# Patient Record
Sex: Male | Born: 1935 | Race: White | Hispanic: No | Marital: Married
Health system: Southern US, Community
[De-identification: ages and names within clinical notes are randomized; demographics above are authoritative.]

---

## 2000-11-01 ENCOUNTER — Emergency Department (HOSPITAL_COMMUNITY): Admission: EM | Admit: 2000-11-01 | Discharge: 2000-11-01 | Payer: Self-pay | Admitting: Emergency Medicine

## 2000-11-01 ENCOUNTER — Encounter: Payer: Self-pay | Admitting: Family Medicine

## 2000-11-01 ENCOUNTER — Encounter: Admission: RE | Admit: 2000-11-01 | Discharge: 2000-11-01 | Payer: Self-pay | Admitting: Family Medicine

## 2004-01-03 ENCOUNTER — Emergency Department (HOSPITAL_COMMUNITY): Admission: EM | Admit: 2004-01-03 | Discharge: 2004-01-03 | Payer: Self-pay | Admitting: *Deleted

## 2004-08-15 ENCOUNTER — Inpatient Hospital Stay (HOSPITAL_COMMUNITY): Admission: EM | Admit: 2004-08-15 | Discharge: 2004-08-17 | Payer: Self-pay | Admitting: Emergency Medicine

## 2004-08-25 ENCOUNTER — Inpatient Hospital Stay (HOSPITAL_COMMUNITY): Admission: EM | Admit: 2004-08-25 | Discharge: 2004-08-30 | Payer: Self-pay | Admitting: Emergency Medicine

## 2004-08-25 ENCOUNTER — Ambulatory Visit: Payer: Self-pay | Admitting: Physical Medicine & Rehabilitation

## 2004-08-29 ENCOUNTER — Encounter: Payer: Self-pay | Admitting: Cardiology

## 2004-08-30 ENCOUNTER — Ambulatory Visit: Payer: Self-pay | Admitting: Cardiology

## 2004-08-30 ENCOUNTER — Inpatient Hospital Stay (HOSPITAL_COMMUNITY)
Admission: RE | Admit: 2004-08-30 | Discharge: 2004-09-07 | Payer: Self-pay | Admitting: Physical Medicine & Rehabilitation

## 2004-09-15 ENCOUNTER — Inpatient Hospital Stay (HOSPITAL_COMMUNITY): Admission: EM | Admit: 2004-09-15 | Discharge: 2004-09-19 | Payer: Self-pay | Admitting: Emergency Medicine

## 2004-09-20 ENCOUNTER — Emergency Department (HOSPITAL_COMMUNITY): Admission: EM | Admit: 2004-09-20 | Discharge: 2004-09-20 | Payer: Self-pay | Admitting: Emergency Medicine

## 2004-09-29 ENCOUNTER — Ambulatory Visit (HOSPITAL_COMMUNITY): Admission: RE | Admit: 2004-09-29 | Discharge: 2004-09-29 | Payer: Self-pay | Admitting: Gastroenterology

## 2005-01-19 ENCOUNTER — Encounter: Admission: RE | Admit: 2005-01-19 | Discharge: 2005-04-19 | Payer: Self-pay | Admitting: Neurology

## 2007-06-27 ENCOUNTER — Encounter: Admission: RE | Admit: 2007-06-27 | Discharge: 2007-06-27 | Payer: Self-pay | Admitting: Family Medicine

## 2008-08-25 ENCOUNTER — Encounter: Admission: RE | Admit: 2008-08-25 | Discharge: 2008-08-25 | Payer: Self-pay | Admitting: Family Medicine

## 2009-06-08 ENCOUNTER — Emergency Department (HOSPITAL_COMMUNITY): Admission: EM | Admit: 2009-06-08 | Discharge: 2009-06-08 | Payer: Self-pay | Admitting: Emergency Medicine

## 2009-08-17 ENCOUNTER — Inpatient Hospital Stay (HOSPITAL_COMMUNITY): Admission: EM | Admit: 2009-08-17 | Discharge: 2009-08-19 | Payer: Self-pay | Admitting: Emergency Medicine

## 2009-08-17 ENCOUNTER — Ambulatory Visit: Payer: Self-pay | Admitting: Cardiology

## 2009-08-18 ENCOUNTER — Encounter (INDEPENDENT_AMBULATORY_CARE_PROVIDER_SITE_OTHER): Payer: Self-pay | Admitting: Internal Medicine

## 2010-08-02 ENCOUNTER — Inpatient Hospital Stay (HOSPITAL_COMMUNITY): Admission: EM | Admit: 2010-08-02 | Discharge: 2010-08-08 | Payer: Self-pay | Admitting: Emergency Medicine

## 2010-08-04 DIAGNOSIS — F068 Other specified mental disorders due to known physiological condition: Secondary | ICD-10-CM

## 2010-09-24 ENCOUNTER — Emergency Department (HOSPITAL_COMMUNITY)
Admission: EM | Admit: 2010-09-24 | Discharge: 2010-09-24 | Payer: Self-pay | Source: Home / Self Care | Admitting: Emergency Medicine

## 2010-10-09 ENCOUNTER — Observation Stay (HOSPITAL_COMMUNITY)
Admission: EM | Admit: 2010-10-09 | Discharge: 2010-10-12 | Payer: Self-pay | Source: Home / Self Care | Attending: Internal Medicine | Admitting: Internal Medicine

## 2010-10-10 LAB — URINALYSIS, ROUTINE W REFLEX MICROSCOPIC
Hgb urine dipstick: NEGATIVE
Ketones, ur: NEGATIVE mg/dL
Leukocytes, UA: NEGATIVE
Nitrite: NEGATIVE
Protein, ur: 100 mg/dL — AB
Specific Gravity, Urine: 1.023 (ref 1.005–1.030)
Urine Glucose, Fasting: NEGATIVE mg/dL
Urobilinogen, UA: 1 mg/dL (ref 0.0–1.0)
pH: 5.5 (ref 5.0–8.0)

## 2010-10-10 LAB — URINE MICROSCOPIC-ADD ON

## 2010-10-10 LAB — DIFFERENTIAL
Basophils Absolute: 0 10*3/uL (ref 0.0–0.1)
Basophils Relative: 0 % (ref 0–1)
Eosinophils Absolute: 0.2 10*3/uL (ref 0.0–0.7)
Eosinophils Relative: 3 % (ref 0–5)
Lymphocytes Relative: 20 % (ref 12–46)
Lymphs Abs: 1.4 10*3/uL (ref 0.7–4.0)
Monocytes Absolute: 0.7 10*3/uL (ref 0.1–1.0)
Monocytes Relative: 9 % (ref 3–12)
Neutro Abs: 4.9 10*3/uL (ref 1.7–7.7)
Neutrophils Relative %: 68 % (ref 43–77)

## 2010-10-10 LAB — BASIC METABOLIC PANEL
BUN: 25 mg/dL — ABNORMAL HIGH (ref 6–23)
CO2: 26 mEq/L (ref 19–32)
Calcium: 9.2 mg/dL (ref 8.4–10.5)
Chloride: 105 mEq/L (ref 96–112)
Creatinine, Ser: 1.2 mg/dL (ref 0.4–1.5)
GFR calc Af Amer: >60 mL/min (ref >60)
GFR calc non Af Amer: 57 mL/min — ABNORMAL LOW (ref >60)
Glucose, Bld: 104 mg/dL — ABNORMAL HIGH (ref 70–99)
Potassium: 4.1 mEq/L (ref 3.5–5.1)
Sodium: 138 mEq/L (ref 135–145)

## 2010-10-10 LAB — CBC
HCT: 35.3 % — ABNORMAL LOW (ref 39.0–52.0)
Hemoglobin: 11.6 g/dL — ABNORMAL LOW (ref 13.0–17.0)
MCH: 31.3 pg (ref 26.0–34.0)
MCHC: 32.9 g/dL (ref 30.0–36.0)
MCV: 95.1 fL (ref 78.0–100.0)
Platelets: 192 10*3/uL (ref 150–400)
RBC: 3.71 MIL/uL — ABNORMAL LOW (ref 4.22–5.81)
RDW: 12.9 % (ref 11.5–15.5)
WBC: 7.2 10*3/uL (ref 4.0–10.5)

## 2010-10-10 LAB — CK TOTAL AND CKMB (NOT AT ARMC)
CK, MB: 1.6 ng/mL (ref 0.3–4.0)
Relative Index: INVALID (ref 0.0–2.5)
Total CK: 32 U/L (ref 7–232)

## 2010-10-10 LAB — LACTIC ACID, PLASMA: Lactic Acid, Venous: 1.2 mmol/L (ref 0.5–2.2)

## 2010-10-10 LAB — MRSA PCR SCREENING: MRSA by PCR: NEGATIVE

## 2010-10-10 LAB — PROCALCITONIN: Procalcitonin: <0.1 ng/mL

## 2010-10-10 LAB — TROPONIN I: Troponin I: 0.02 ng/mL (ref 0.00–0.06)

## 2010-10-12 LAB — COMPREHENSIVE METABOLIC PANEL
ALT: 21 U/L (ref 0–53)
AST: 26 U/L (ref 0–37)
Albumin: 2.7 g/dL — ABNORMAL LOW (ref 3.5–5.2)
Alkaline Phosphatase: 44 U/L (ref 39–117)
BUN: 21 mg/dL (ref 6–23)
CO2: 26 mEq/L (ref 19–32)
Calcium: 8.5 mg/dL (ref 8.4–10.5)
Chloride: 105 mEq/L (ref 96–112)
Creatinine, Ser: 1.12 mg/dL (ref 0.4–1.5)
GFR calc Af Amer: >60 mL/min (ref >60)
GFR calc non Af Amer: >60 mL/min (ref >60)
Glucose, Bld: 94 mg/dL (ref 70–99)
Potassium: 3.2 mEq/L — ABNORMAL LOW (ref 3.5–5.1)
Sodium: 136 mEq/L (ref 135–145)
Total Bilirubin: 0.6 mg/dL (ref 0.3–1.2)
Total Protein: 5.7 g/dL — ABNORMAL LOW (ref 6.0–8.3)

## 2010-10-12 LAB — VITAMIN B12: Vitamin B-12: 1473 pg/mL — ABNORMAL HIGH (ref 211–911)

## 2010-10-12 LAB — BASIC METABOLIC PANEL
BUN: 17 mg/dL (ref 6–23)
CO2: 23 mEq/L (ref 19–32)
Calcium: 8.8 mg/dL (ref 8.4–10.5)
Chloride: 112 mEq/L (ref 96–112)
Creatinine, Ser: 1.12 mg/dL (ref 0.4–1.5)
GFR calc Af Amer: >60 mL/min (ref >60)
GFR calc non Af Amer: >60 mL/min (ref >60)
Glucose, Bld: 92 mg/dL (ref 70–99)
Potassium: 4.2 mEq/L (ref 3.5–5.1)
Sodium: 141 mEq/L (ref 135–145)

## 2010-10-12 LAB — CARDIAC PANEL(CRET KIN+CKTOT+MB+TROPI)
CK, MB: 1.9 ng/mL (ref 0.3–4.0)
Relative Index: INVALID (ref 0.0–2.5)
Total CK: 40 U/L (ref 7–232)
Troponin I: 0.05 ng/mL (ref 0.00–0.06)

## 2010-10-12 LAB — PHOSPHORUS: Phosphorus: 3 mg/dL (ref 2.3–4.6)

## 2010-10-12 LAB — HEPATIC FUNCTION PANEL
ALT: 23 U/L (ref 0–53)
AST: 33 U/L (ref 0–37)
Albumin: 3 g/dL — ABNORMAL LOW (ref 3.5–5.2)
Alkaline Phosphatase: 50 U/L (ref 39–117)
Bilirubin, Direct: 0.3 mg/dL (ref 0.0–0.3)
Indirect Bilirubin: 0.4 mg/dL (ref 0.3–0.9)
Total Bilirubin: 0.7 mg/dL (ref 0.3–1.2)
Total Protein: 6.2 g/dL (ref 6.0–8.3)

## 2010-10-12 LAB — CBC
HCT: 33.9 % — ABNORMAL LOW (ref 39.0–52.0)
Hemoglobin: 11.5 g/dL — ABNORMAL LOW (ref 13.0–17.0)
MCH: 32 pg (ref 26.0–34.0)
MCHC: 33.9 g/dL (ref 30.0–36.0)
MCV: 94.4 fL (ref 78.0–100.0)
Platelets: 197 10*3/uL (ref 150–400)
RBC: 3.59 MIL/uL — ABNORMAL LOW (ref 4.22–5.81)
RDW: 13.2 % (ref 11.5–15.5)
WBC: 9.9 10*3/uL (ref 4.0–10.5)

## 2010-10-12 LAB — URINE CULTURE
Colony Count: NO GROWTH
Culture  Setup Time: 201201151741
Culture: NO GROWTH

## 2010-10-12 LAB — MAGNESIUM
Magnesium: 1.9 mg/dL (ref 1.5–2.5)
Magnesium: 1.9 mg/dL (ref 1.5–2.5)

## 2010-10-12 LAB — AMMONIA
Ammonia: UNDETERMINED umol/L (ref 11–35)
Ammonia: 12 umol/L (ref 11–35)

## 2010-10-12 LAB — TSH: TSH: 2.531 u[IU]/mL (ref 0.350–4.500)

## 2010-10-12 LAB — LIPASE, BLOOD: Lipase: 22 U/L (ref 11–59)

## 2010-10-14 NOTE — Discharge Summary (Signed)
NAME:  Michael Case, Michael Case              ACCOUNT NO.:  [id number]  MEDICAL RECORD NO.:  [id number]          PATIENT TYPE:  INP  LOCATION:  4709                         FACILITY:  [facility]  PHYSICIAN:  Kathlen Mody, MD       DATE OF BIRTH:  01-Nov-1920  DATE OF ADMISSION:  10/09/2010 DATE OF DISCHARGE:                              DISCHARGE SUMMARY   PRIMARY CARE PHYSICIAN:  Lupita Raider, M.D.  PRIMARY DISCHARGE DIAGNOSIS:  Worsening dementia.  OTHER DIAGNOSES: 1. Vascular dementia with behavioral disturbances. 2. Hypertension. 3. Recurrent falls. 4. Chronic kidney disease stage 3. 5. Cerebrovascular accident with right-sided weakness. 6. Benign prostatic hypertrophy. 7. Cervical spondylosis. 8. Hyperlipidemia. 9. Erosive esophagitis. 10.Anemia. 11.Ataxia. 12.Abdominal aortic aneurysm. 13.Gastrointestinal bleed secondary to duodenal ulcers. 14.Dyslipidemia.  DISCHARGE MEDICATIONS: 1. Amlodipine 10 mg p.o. daily. 2. Aspirin/dipyridamole combination that is Aggrenox 1 capsule p.o.     b.i.d. 3. Atenolol 50 mg p.o. daily. 4. Depakote 250 mg p.o. b.i.d. 5. Hydralazine 100 mg b.i.d. 6. Risperidone 0.5 mg at bedtime. 7. Senna 2 tabs p.o. daily. 8. Vicodin 1-2 tablets p.o. q.4 hours p.r.n. 9. MiraLax 17 g p.o. daily p.r.n.  PERTINENT LABS:  She had a CBC done on October 11, 2010, that showed: WBC count of 9.9, hemoglobin of 11.5, hematocrit of 33.9, and platelets of 197.  Sodium of 141, potassium 4.2, chloride 112, bicarb 23, glucose 92, BUN 17, creatinine 1.12, mag of 1.9.  TSH of 2.531, vitamin B12 of 1473.  Urine culture:  No growth.  Ammonia level was 12.  Lipase level was 22.  Cardiac enzymes with CK/MB and troponin negative.  MRSA screen negative.  Liver function tests normal limits.  Procalcitonin negative. Lactic acid 1.2.  Urinalysis was negative.  RADIOLOGY/IMAGING:  Chest x-ray:  No acute cardiopulmonary disease. Abdominal x-ray:  No evidence of obstruction.  Fecal  burden suggesting constipation.  CT head without contrast:  No acute abnormality, ischemic change, or atrophy; appears unchanged.  BRIEF HOSPITAL COURSE:  This is an 77 year old gentleman with past medical history of vascular dementia, multiple cerebrovascular accidents with chronic right-sided weakness, history of behavioral disturbances, who was admitted for decreased oral intake to both solids and liquids for the last 3-4 days. 1. Decreased oral intake.  This was accounted to be secondary to     progression of his dementia.  A CT of the head without contrast was     done to rule out stroke.  He was worked up for encephalopathy and     everything came back negative.  TSH was normal.  LFTs were normal.     Lipase and ammonia levels were normal. 2. The patient has dementia with episodes of acute delirium and that     the best care for the patient would be placement at SNF.  At this     time, we will continue with his medications which are Depakote,     Risperdal, and pain medications as needed.  Social worker consult was called and the family discussed with the social worker may find     financial arrangements for the patient to be discharged to [facility]. 3. The rest of the issues include hypertension which was suboptimally     controlled.  The patient was on Norvasc and atenolol and     hydralazine, and we increased the hydralazine to 100 mg twice a     day, increase the atenolol to 50 mg daily. 4. For his multiple cerebrovascular accidents in the past, the patient     was continued on his Aggrenox 1 capsule b.i.d.  The rest of the assessment will be done by the doctor discharging the patient tomorrow.          ______________________________ Kathlen Mody, MD     VA/MEDQ  D:  10/11/2010  T:  10/11/2010  Job:  [id number]  Electronically Signed by Kathlen Mody MD on 10/14/2010 11:25:17 AM

## 2010-10-17 LAB — EYE CULTURE

## 2010-11-24 DEATH — deceased

## 2010-12-06 LAB — DIFFERENTIAL
Basophils Absolute: 0.1 10*3/uL (ref 0.0–0.1)
Basophils Relative: 1 % (ref 0–1)
Eosinophils Absolute: 0.4 10*3/uL (ref 0.0–0.7)
Eosinophils Relative: 5 % (ref 0–5)
Lymphocytes Relative: 20 % (ref 12–46)
Lymphocytes Relative: 22 % (ref 12–46)
Lymphs Abs: 1.9 10*3/uL (ref 0.7–4.0)
Monocytes Absolute: 0.5 10*3/uL (ref 0.1–1.0)
Monocytes Relative: 9 % (ref 3–12)
Monocytes Relative: 9 % (ref 3–12)
Neutro Abs: 3.9 10*3/uL (ref 1.7–7.7)
Neutrophils Relative %: 63 % (ref 43–77)

## 2010-12-06 LAB — BASIC METABOLIC PANEL
CO2: 30 mEq/L (ref 19–32)
Calcium: 9 mg/dL (ref 8.4–10.5)
Calcium: 9.1 mg/dL (ref 8.4–10.5)
Calcium: 9.3 mg/dL (ref 8.4–10.5)
Calcium: 9.4 mg/dL (ref 8.4–10.5)
Chloride: 102 mEq/L (ref 96–112)
Chloride: 105 mEq/L (ref 96–112)
Creatinine, Ser: 1.32 mg/dL (ref 0.4–1.5)
GFR calc Af Amer: 57 mL/min — ABNORMAL LOW (ref >60)
GFR calc Af Amer: 59 mL/min — ABNORMAL LOW (ref >60)
GFR calc Af Amer: 60 mL/min — ABNORMAL LOW (ref >60)
GFR calc Af Amer: >60 mL/min (ref >60)
GFR calc non Af Amer: 47 mL/min — ABNORMAL LOW (ref >60)
GFR calc non Af Amer: 49 mL/min — ABNORMAL LOW (ref >60)
Glucose, Bld: 104 mg/dL — ABNORMAL HIGH (ref 70–99)
Glucose, Bld: 95 mg/dL (ref 70–99)
Potassium: 3.8 mEq/L (ref 3.5–5.1)
Potassium: 4.3 mEq/L (ref 3.5–5.1)
Sodium: 137 mEq/L (ref 135–145)
Sodium: 138 mEq/L (ref 135–145)
Sodium: 138 mEq/L (ref 135–145)
Sodium: 141 mEq/L (ref 135–145)

## 2010-12-06 LAB — COMPREHENSIVE METABOLIC PANEL
ALT: 10 U/L (ref 0–53)
AST: 24 U/L (ref 0–37)
Albumin: 2.8 g/dL — ABNORMAL LOW (ref 3.5–5.2)
Calcium: 8.9 mg/dL (ref 8.4–10.5)
GFR calc Af Amer: 58 mL/min — ABNORMAL LOW (ref >60)
Sodium: 138 mEq/L (ref 135–145)
Total Protein: 6.5 g/dL (ref 6.0–8.3)

## 2010-12-06 LAB — CBC
HCT: 29.8 % — ABNORMAL LOW (ref 39.0–52.0)
HCT: 30.3 % — ABNORMAL LOW (ref 39.0–52.0)
Hemoglobin: 10.1 g/dL — ABNORMAL LOW (ref 13.0–17.0)
Hemoglobin: 10.3 g/dL — ABNORMAL LOW (ref 13.0–17.0)
Hemoglobin: 10.4 g/dL — ABNORMAL LOW (ref 13.0–17.0)
MCH: 32.1 pg (ref 26.0–34.0)
MCH: 32.5 pg (ref 26.0–34.0)
MCHC: 33.9 g/dL (ref 30.0–36.0)
MCHC: 34 g/dL (ref 30.0–36.0)
MCV: 95.8 fL (ref 78.0–100.0)
Platelets: 297 10*3/uL (ref 150–400)
Platelets: 298 10*3/uL (ref 150–400)
RBC: 3.11 MIL/uL — ABNORMAL LOW (ref 4.22–5.81)
RBC: 3.24 MIL/uL — ABNORMAL LOW (ref 4.22–5.81)
RDW: 12.5 % (ref 11.5–15.5)
WBC: 6.1 10*3/uL (ref 4.0–10.5)
WBC: 6.5 10*3/uL (ref 4.0–10.5)
WBC: 8.7 10*3/uL (ref 4.0–10.5)

## 2010-12-06 LAB — LIPID PANEL
Cholesterol: 153 mg/dL (ref 0–200)
HDL: 38 mg/dL — ABNORMAL LOW (ref >39)
LDL Cholesterol: 90 mg/dL (ref 0–99)
Triglycerides: 125 mg/dL (ref <150)

## 2010-12-06 LAB — FOLATE: Folate: >20 ng/mL

## 2010-12-06 LAB — URINALYSIS, ROUTINE W REFLEX MICROSCOPIC
Glucose, UA: NEGATIVE mg/dL
Hgb urine dipstick: NEGATIVE
Specific Gravity, Urine: 1.017 (ref 1.005–1.030)
Urobilinogen, UA: 0.2 mg/dL (ref 0.0–1.0)

## 2010-12-06 LAB — CARDIAC PANEL(CRET KIN+CKTOT+MB+TROPI)
CK, MB: 1.6 ng/mL (ref 0.3–4.0)
CK, MB: 1.7 ng/mL (ref 0.3–4.0)
Relative Index: INVALID (ref 0.0–2.5)
Relative Index: INVALID (ref 0.0–2.5)
Total CK: 66 U/L (ref 7–232)
Total CK: 71 U/L (ref 7–232)

## 2010-12-06 LAB — MAGNESIUM: Magnesium: 2.1 mg/dL (ref 1.5–2.5)

## 2010-12-06 LAB — PHOSPHORUS: Phosphorus: 3.7 mg/dL (ref 2.3–4.6)

## 2010-12-06 LAB — TROPONIN I: Troponin I: 0.04 ng/mL (ref 0.00–0.06)

## 2010-12-06 LAB — TSH: TSH: 2.495 u[IU]/mL (ref 0.350–4.500)

## 2010-12-06 LAB — CK TOTAL AND CKMB (NOT AT ARMC)
Relative Index: INVALID (ref 0.0–2.5)
Total CK: 60 U/L (ref 7–232)
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
## 2010-12-28 LAB — LIPID PANEL
LDL Cholesterol: 116 mg/dL — ABNORMAL HIGH (ref 0–99)
Total CHOL/HDL Ratio: 5.6 RATIO
VLDL: 31 mg/dL (ref 0–40)

## 2010-12-28 LAB — COMPREHENSIVE METABOLIC PANEL
AST: 47 U/L — ABNORMAL HIGH (ref 0–37)
Albumin: 3.5 g/dL (ref 3.5–5.2)
BUN: 19 mg/dL (ref 6–23)
Calcium: 9.2 mg/dL (ref 8.4–10.5)
Creatinine, Ser: 1.64 mg/dL — ABNORMAL HIGH (ref 0.4–1.5)
GFR calc Af Amer: 48 mL/min — ABNORMAL LOW (ref >60)
Total Protein: 6.8 g/dL (ref 6.0–8.3)

## 2010-12-28 LAB — DIFFERENTIAL
Basophils Absolute: 0.1 10*3/uL (ref 0.0–0.1)
Lymphocytes Relative: 16 % (ref 12–46)
Lymphs Abs: 1.1 10*3/uL (ref 0.7–4.0)
Monocytes Absolute: 0.7 10*3/uL (ref 0.1–1.0)
Monocytes Relative: 10 % (ref 3–12)
Neutro Abs: 4.7 10*3/uL (ref 1.7–7.7)

## 2010-12-28 LAB — CBC
HCT: 34.1 % — ABNORMAL LOW (ref 39.0–52.0)
MCV: 99 fL (ref 78.0–100.0)
Platelets: 163 10*3/uL (ref 150–400)
RDW: 13.1 % (ref 11.5–15.5)

## 2010-12-28 LAB — CARDIAC PANEL(CRET KIN+CKTOT+MB+TROPI)
CK, MB: 1.7 ng/mL (ref 0.3–4.0)
CK, MB: 1.9 ng/mL (ref 0.3–4.0)
Relative Index: INVALID (ref 0.0–2.5)
Troponin I: 0.02 ng/mL (ref 0.00–0.06)
Troponin I: 0.02 ng/mL (ref 0.00–0.06)

## 2010-12-28 LAB — HEMOGLOBIN A1C
Hgb A1c MFr Bld: 5.8 % (ref 4.6–6.1)
Mean Plasma Glucose: 120 mg/dL

## 2010-12-28 LAB — URINALYSIS, ROUTINE W REFLEX MICROSCOPIC
Nitrite: NEGATIVE
Specific Gravity, Urine: 1.013 (ref 1.005–1.030)
Urobilinogen, UA: 0.2 mg/dL (ref 0.0–1.0)
pH: 7 (ref 5.0–8.0)

## 2010-12-28 LAB — CK TOTAL AND CKMB (NOT AT ARMC)
CK, MB: 1.7 ng/mL (ref 0.3–4.0)
Relative Index: 1.5 (ref 0.0–2.5)
Total CK: 116 U/L (ref 7–232)

## 2010-12-28 LAB — HOMOCYSTEINE: Homocysteine: 11.9 umol/L (ref 4.0–15.4)

## 2010-12-30 LAB — CBC
HCT: 36.9 % — ABNORMAL LOW (ref 39.0–52.0)
MCV: 100.1 fL — ABNORMAL HIGH (ref 78.0–100.0)
Platelets: 192 10*3/uL (ref 150–400)
RDW: 13.1 % (ref 11.5–15.5)

## 2010-12-30 LAB — DIFFERENTIAL
Lymphocytes Relative: 24 % (ref 12–46)
Monocytes Absolute: 0.7 10*3/uL (ref 0.1–1.0)
Monocytes Relative: 9 % (ref 3–12)
Neutro Abs: 4.3 10*3/uL (ref 1.7–7.7)

## 2010-12-30 LAB — COMPREHENSIVE METABOLIC PANEL
Albumin: 3.8 g/dL (ref 3.5–5.2)
BUN: 20 mg/dL (ref 6–23)
Creatinine, Ser: 1.61 mg/dL — ABNORMAL HIGH (ref 0.4–1.5)
Potassium: 3.6 mEq/L (ref 3.5–5.1)
Total Protein: 7.6 g/dL (ref 6.0–8.3)

## 2010-12-30 LAB — POCT CARDIAC MARKERS
CKMB, poc: 1 ng/mL (ref 1.0–8.0)
Myoglobin, poc: 110 ng/mL (ref 12–200)
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
## 2011-02-10 NOTE — Consult Note (Signed)
NAME:  MATTEO, BANKE NO.:  [id number]   MEDICAL RECORD NO.:  [id number]          PATIENT TYPE:  INP   LOCATION:  3301                         FACILITY:  [facility]   PHYSICIAN:  Graylin Shiver, M.D.   DATE OF BIRTH:  09/15/1921   DATE OF CONSULTATION:  09/15/2004  DATE OF DISCHARGE:                                   CONSULTATION   REASON FOR CONSULTATION:  This patient is an 77 year old white male recently  discharged from the hospital after a stroke.  He presented to the emergency  room with complaints of melena, which occurred approximately four hours ago.  The patient's wife states that he had an episode of black-colored bowel  movement and that he passed out.  She called 911 and the EMS came, and  brought him to the hospital.  The patient denies any abdominal pain,  vomiting or hematemesis.  He has not had any further passing of melena since  the one episode four hours ago.  The patient's wife states that she told him  yesterday that he was looking pale and she also states that he has not been  feeling very good for the last two or three days.   PAST HISTORY:  Medical problems:  1.  History of a stroke.  2.  Hypertension.  3.  Benign prostatic hypertrophy.   Surgeries:  Ankle pinning.   SOCIAL HISTORY:  Negative for alcohol or tobacco.   SYSTEMS REVIEW:  The patient denies any angina, chest pain, shortness of  breath, cough, or sputum production.   MEDICATIONS:  Medications taken prior to admission include Aggrenox, Tricor,  Lotrel and UroXatral.   PHYSICAL EXAMINATION:  VITAL SIGNS:  Blood pressure is 131/73, pulse 92 and  respirations 19.  GENERAL APPEARANCE:  The patient does not appear in any acute distress at  this time.  HEENT:  The patient is nonicteric.  HEART:  Regular rhythm.  No murmurs are heard.  LUNGS:  The lungs are clear.  ABDOMEN:  Bowel sounds are normal.  Soft and nontender.  No  hepatosplenomegaly.   LABORATORY DATA:  Hemoglobin  is 7.1.   IMPRESSION:  1.  Melena, suspect upper gastrointestinal source, rule out peptic ulcer,      rule out gastritis versus other upper gastrointestinal lesion.  2.  History of stroke.   PLAN:  The patient has been admitted to the hospital by Dr. Tresa Endo.  He is  going to start Protonix 40 mg IV q.12 hours.  The patient has received one  unit of packed red cells already and is receiving a second unit, and will be  receiving another unit of packed red cells.  I will proceed with EGD in the  morning to evaluate the upper GI tract.       SFG/MEDQ  D:  09/15/2004  T:  09/16/2004  Job:  [id number]   cc:   Sherin Quarry, MD   Donia Guiles, M.D.  [address] [place]  [place] [address]  Fax: [phone]

## 2011-02-10 NOTE — Discharge Summary (Signed)
NAME:  Michael Case, Michael Case              ACCOUNT NO.:  [id number]   MEDICAL RECORD NO.:  [id number]          PATIENT TYPE:  INP   LOCATION:  3005                         FACILITY:  [facility]   PHYSICIAN:  Pramod P. Pearlean Brownie, MD    DATE OF BIRTH:  [birth year]   DATE OF ADMISSION:  08/24/2004  DATE OF DISCHARGE:  08/30/2004                                 DISCHARGE SUMMARY   DIAGNOSES AT TIME OF DISCHARGE:  1.  New left anterior circulation infarct in the setting of recent left      posterior circulation infarct.  2.  Report of rectal bleeding with bowel movements at home.  3.  Hypertension.  4.  Dyslipidemia.  5.  Prostatism.  6.  History of left occipital embolic infarct August 17, 2004 without      etiology identified on Aggrenox.   DISCHARGE MEDICATIONS:  1.  Tricor 48 mg daily.  2.  Norvasc 10 mg daily.  3.  Lotensin 20 mg daily.  4.  Uroxatral 10 mg daily.  5.  Aggrenox 1 tablet b.i.d.  6.  Colace 100 mg b.i.d.   STUDIES PERFORMED:  1.  CT of the brain on admission shows stroke evolution and subacute strokes      in the left PCA distribution and the left cerebellum from August 16, 2004.  No signs of stroke extension, acute hemorrhage, or other mass      effect.  2.  MRI of the brain shows development of an acute left MCA distribution      infarct which is extensive involving the left frontal and parietal      lobes.  There is evidence of evolution of left PCA, left occipital lobe      infarct which appears larger now with more extensive signal alteration      and coverage of a larger portion of the left occipital lobe plus      hemorrhagic transformation.  3.  MRA of the brain shows subtle interval changes of the left MCA distal      branches when compared to November 22nd.  Left SCA and PCA disease.  4.  TEE performed by Dr. Olga Millers shows no evidence for mitral or      aortic valvular vegetations, no findings to suggest a thrombus in left      ventricle or on the  left atrial appendage.  There was some left      ventricular hypertrophy.  5.  EKG shows normal sinus rhythm with moderate voltage criteria for LVH,      may be a normal variant, T-wave abnormality consider lateral ischemia.  6.  Transcranial Doppler was negative with no abnormal intracardiac      communication.   LABORATORY STUDIES:  CBC normal, differential with eosinophils elevated at  7.  Chemistry normal except for glucose slightly elevated at 112, bicarb  slightly elevated at 26.7.  Coagulation studies normal.   HISTORY OF PRESENT ILLNESS:  [name] is an 77 year old right-  handed white male who was just discharged from the stroke service a week  ago  after suffering a left occipital embolic infarct with right visual field  loss.  He was worked up extensively and discharged on Aggrenox and Tricor.  The mechanism of the stroke was felt to be atheroemboli from a narrowed  right vertebral artery.  The patient reports this evening he was bathing  about 5:30 p.m. and while drying off noticed an onset of right-sided  hemiparesis and sensory change.  He noticed problems with his right leg when  he was walking.  He talked to his wife and noticed that he had some hesitant  and slurred speech.  EMS was called and the patient was brought to the [facility] Room where he gradually improved.  Although he feels  subjectively that his right side has not come all the way back around to  normal, it is felt that he is near baseline.  He will be admitted to the  hospital for further stroke workup.   HOSPITAL COURSE:  MRI did reveal a new infarct in new territory on the left  anterior MCA division.  Since he had 2 separate infarcts within a short  period of time in 2 distributions, it is felt his stroke was mostly  cardioembolic.  A 2-D echo and carotid Doppler last week were normal so we  will perform a TEE.  This TEE was performed by Dr. Jens Som with no etiology  of the stroke  identified.  Preference would have been to place the patient  on the IV heparin while the workup was pending; however, he had had an  episode of rectal bleeding per him and his wife after discharge.  Therefore,  heparin was held.  Without obvious embolic etiology found, the patient was  placed back on Aggrenox and this will be continued long-term.  PT and OT and  speech therapy all saw him and he does have some hemiparesis and decreased  ability with his walking.  Therefore, he will need short-term rehab before  returns home with wife.   CONDITION ON DISCHARGE:  The patient alert and oriented x3.  Speech clear.  No aphasia.  He has mild right hemiparesis with clumsiness but 5-/5 strength  in his upper extremity and lower extremity.  He was not ambulated the day of  discharge.   DISCHARGE PLAN:  1.  Transfer to rehab for continuation of therapy.  2.  Aggrenox for secondary stroke prevention.  3.  New Tricor for dyslipidemia.  4.  Follow up primary care physician in 1 month.  5.  Follow up with Dr. Pearlean Brownie in 2-3 months.       SB/MEDQ  D:  08/30/2004  T:  08/30/2004  Job:  [id number]   cc:   Donia Guiles, M.D.  [address] [place]  [place] [address]  Fax: [phone]

## 2011-02-10 NOTE — Discharge Summary (Signed)
NAME:  [name], [name]              ACCOUNT NO.:  [id number]   MEDICAL RECORD NO.:  [id number]          PATIENT TYPE:  INP   LOCATION:  3028                         FACILITY:  [facility]   PHYSICIAN:  Donia Guiles, M.D.   DATE OF BIRTH:  Dec 02, 1920   DATE OF ADMISSION:  08/15/2004  DATE OF DISCHARGE:  08/17/2004                                 DISCHARGE SUMMARY   ADDENDUM:  As noted in discharge summary, the patient had white blood cell  increase from 8 to 23 with negative urine culture, however, had traumatic  Foley insertion.  Foley discontinued.  Patient advised to remain in the  hospital until able to void independently.  Will put on Cipro 500 mg b.i.d.  x3 days.  Patient instructed to call Dr. Donia Guiles if he has any  problems voiding over the next few days.  The patient is very belligerent  and when instructed he will remain in hospital, however, my best  professional advise to him.  His wife was at the bedside and understands.  UA and urine culture negative as of yesterday with no growth x1 day.       SB/MEDQ  D:  08/17/2004  T:  08/18/2004  Job:  [id number]

## 2011-02-10 NOTE — H&P (Signed)
NAME:  Michael Case, FANT NO.:  [id number]   MEDICAL RECORD NO.:  [id number]          PATIENT TYPE:  EMS   LOCATION:  MAJO                         FACILITY:  [facility]   PHYSICIAN:  Deanna Artis. Hickling, M.D.DATE OF BIRTH:  [birth year]   DATE OF ADMISSION:  08/15/2004  DATE OF DISCHARGE:                                HISTORY & PHYSICAL   CHIEF COMPLAINT:  Cannot see to the right.   HISTORY OF PRESENT ILLNESS:  This is an 77 year old with hypertension,  dyslipidemia, very remote history of smoking, who had a loss of vision to  the right of midline this weekend lasting for minutes. He did not tell his  wife about it. He finished lunch today at 12:30 p.m. His wife went upstairs  to wrap Christmas gifts. He sat in his chair and fell asleep. He got up some  time between 2 and 2:30, but he thinks he was awake before that. He became  aware that he could not walk and was stumbling into things on his way to the  bathroom. He called upstairs, his wife became aware of his situation around  2:15, and he arrived at [facility] at 2:45. Code stroke was called  at 2:47. He had a CT scan at 3 p.m. and I interpreted immediately. It showed  a remote left parietal cortical/subcortical stroke involving the  superior/posterior division of the left middle cerebral artery and also a  small left lacunar insular region, mild central greater than cortical  atrophy, some arterial calcification. TPA was ordered and arrived at 15:35.  His vision had cleared by that time in the interim.   PAST MEDICAL HISTORY:  As noted above. The patient may also have prostatism.  The patient had a CT scan for an episode of becoming ill, vomiting, and  somewhat gray in church. CT scan showed the same remote strokes back in  April 2005. We do not know when the patient had what appears to be silent  strokes.   PAST SURGICAL HISTORY:  Bilateral iridectomies. He had a plate put in his  ankle for a fracture in  1979.   REVIEW OF SYSTEMS:  Negative for fever, illness, or injury. No other organ  disease. 12-system review is negative.   MEDICATIONS:  The patient does not have them with him and cannot remember  what they are.   DRUG ALLERGIES:  None known.   FAMILY HISTORY:  Brother had a large left hemispheric infarction and is in a  nursing home. He is age 71. Both parents died of unknown reason.   SOCIAL HISTORY:  This is the patient's second marriage for 10 years. No  tobacco since 1960. No alcohol.   PHYSICAL EXAMINATION:  VITAL SIGNS: Temperature 98.3, blood pressure 169/77,  resting pulse 80, respirations 20. Pulse oximetry 97%.  HEENT: No bruits. Supple neck. No meningismus.  LUNGS: Clear.  HEART: No murmurs. Pulses normal.  ABDOMEN: Soft, bowel sounds normal.  EXTREMITIES: Normal.  NEUROLOGIC: Mental status--the patient is awake, alert, and no dysphagia.  The patient has fair memory. Initially, the patient  had a dense  homonymous  hemianopsia. This persisted from the time we saw the patient at 3:00 until  about 3:35. Cranial nerves--reactive pupils. Fundi normal. Visual fields  full to double simultaneous stimuli. Symmetric facial strength. Midline  tongue and uvula. Air conduction greater than bone conduction. Visual acuity  20/30 at worse. It may be better than that. His glasses were on. Motor exam-  -normal strength, tone, and mass. Good fine motor movements. No pronator  drift. Sensation--intact to cortical modalities. He has a mild peripheral  polyneuropathy. Cerebellar examination--good finger-to-nose. Rapid  alternating movements. Gait was not tested.  Reflexes are absent. The  patient has bilateral flexor plantar responses.   IMPRESSION:  Recurrent transient ischemic attack, left brain posterior  cerebral artery distribution. We need to rule out a basilar artery lesion.  We will hold PTA. The patient will have a cerebral angiogram. The patient  will also have an MRI, MRA,  2-D echocardiogram. Will perform a workup for  treatable for risk factors.       WHH/MEDQ  D:  08/15/2004  T:  08/15/2004  Job:  [id number]   cc:   Donia Guiles, M.D.  [address] [place]  [place] [address]  Fax: [phone]

## 2011-02-10 NOTE — Op Note (Signed)
NAME:  Michael Case, Michael Case NO.:  [id number]   MEDICAL RECORD NO.:  [id number]          PATIENT TYPE:  AMB   LOCATION:  ENDO                         FACILITY:  [facility]   PHYSICIAN:  Graylin Shiver, M.D.   DATE OF BIRTH:  [birth year]   DATE OF PROCEDURE:  09/29/2004  DATE OF DISCHARGE:                                 OPERATIVE REPORT   PROCEDURE:  Colonoscopy.   INDICATIONS:  Rectal bleeding.   Informed consent was obtained after explanation of risks of bleeding,  infection, perforation.   PREMEDICATION:  Fentanyl 40 mcg IV, Versed 5 milligrams IV.   PROCEDURE:  With the patient in the left lateral decubitus position, a  rectal exam was performed. No masses were felt. The Olympus colonoscope was  inserted into the rectum and advanced around the colon to the cecum. Cecal  landmarks were identified. The cecum and ascending colon looked normal. The  transverse colon looked normal. The descending colon, sigmoid and rectum  looked normal. The scope was retroflexed in the rectum and internal  hemorrhoids were present.  The scope was straightened and brought out.  He  tolerated the procedure well without complications.   IMPRESSION:  Normal colonoscopy to the cecum with internal hemorrhoids.   COMMENT:  I believe that the patient's rectal bleeding is secondary to  internal hemorrhoids.       SFG/MEDQ  D:  09/29/2004  T:  09/29/2004  Job:  [id number]   cc:   Donia Guiles, M.D.  [address] [place]  [place] [address]  Fax: [phone]

## 2011-02-10 NOTE — Discharge Summary (Signed)
NAME:  Michael Case, Michael Case              ACCOUNT NO.:  [id number]   MEDICAL RECORD NO.:  [id number]          PATIENT TYPE:  INP   LOCATION:  4741                         FACILITY:  [facility]   PHYSICIAN:  Jackie Plum, M.D.DATE OF BIRTH:  [birth year]   DATE OF ADMISSION:  09/14/2004  DATE OF DISCHARGE:  09/19/2004                                 DISCHARGE SUMMARY   DIAGNOSES ON DISCHARGE:  1.  Upper gastrointestinal bleed secondary to duodenal ulcers.      1.  EGD done on September 15, 2004, by Dr. Evette Cristal showed two duodenal          ulcers, one of which had a visible vessel.  This was injected and          cauterized.  2.  Erosive gastritis.  The patient was continued on Protonix forever, and      antiplatelet medication was held briefly.  3.  Anemia of blood loss, improved, status post packed red blood cell      transfusion.   DISCHARGE MEDICATIONS:  1.  The patient was asked to resume all preadmission medicines, however,      advised to hold Aggrenox for two weeks and then restart as previously.  2.  Protonix 40 mg daily (new medicine).   ACTIVITY:  As tolerated.   FOLLOWUP:  He is to follow up with Dr. Nobie Putnam, the patient's PCP in one  week.  He is to call for an appointment.   DISCHARGE LABORATORIES:  Hemoglobin 9.7, hematocrit 28.0.  Sodium 137,  potassium 4.6, chloride 104, glucose 137, BUN 58, creatinine 2.0.   REASON FOR ADMISSION:  Hematochezia.   HISTORY OF PRESENT ILLNESS:  The patient is an 77 year old Caucasian  gentleman with a recent history of embolic CVA on Aggrenox, hypertension,  dyslipidemia and BPH.  He presented with hematochezia on September 14, 2004.  He has had some constipation and was receiving treatment for this.  On  admission, the patient was noted to be anemic with hemoglobin of 7.1.  He  did not have any fever, chills, shortness of breath, chest pain, abdominal  pain, nausea, vomiting, or hematemesis.   PHYSICAL EXAMINATION:  On admission, the  patient's cardiopulmonary exam was  unremarkable.  Abdominal exam was benign.   He was therefore admitted for management of his GI bleed, which was  associated with a single episode.   HOSPITAL COURSE:  The patient was admitted to the hospitalist service, and  antiplatelet therapy was held, and he received a total of four units of  packed red blood cell transfusion.  IV Protonix q.12h. was also initiated.  In view of concurrent elevation of his BUN, it was deemed appropriate to  initiate workup with EGD to rule out any upper GI source and to follow this  up with a lower GI endoscopy should upper GI evaluation be negative.  EGD  was done, as noted above, and no further workup was instituted.  The patient  was started initially on liquids, and, subsequently, his diet was advanced  without any problems.  He remained hemodynamically stable without any  further drops  in his hematologic indices, and was successfully ambulatory  without any dizziness.  He was therefore discharged by Dr. Virginia Rochester  after evaluating the patient on September 18, 2005.  The patient had been  seen by Dr. Matthias Hughs of GI medicine.  GI medicine recommended that the  patient's Aggrenox be held about one to two weeks and then be restarted  thereafter.  The patient was discharged home in a stable and satisfactory  condition, according to Dr. Chancy Milroy evaluation.   CONSULTANTS:  Herbert Moors, M.D., Bernette Redbird, M.D.   PROCEDURES:  EGD, as listed above.   CONDITION ON DISCHARGE:  Improved/satisfactory.      GO/MEDQ  D:  12/22/2004  T:  12/22/2004  Job:  [id number]

## 2011-02-10 NOTE — Discharge Summary (Signed)
NAME:  Michael Case, Michael Case              ACCOUNT NO.:  [id number]   MEDICAL RECORD NO.:  [id number]          PATIENT TYPE:  INP   LOCATION:  3028                         FACILITY:  [facility]   PHYSICIAN:  Pramod P. Pearlean Brownie, MD    DATE OF BIRTH:  1921/07/23   DATE OF ADMISSION:  08/15/2004  DATE OF DISCHARGE:  08/17/2004                                 DISCHARGE SUMMARY   DIAGNOSES AT TIME OF DISCHARGE:  1.  Left occipital and cerebellar infarct most likely secondary to proximal      right vertebral artery stenosis per angiography.  2.  Hypertriglyceridemia.  3.  Hypertension.  4.  Prostatism.  5.  Status post bilateral iridectomies.  6.  Status post plate in his ankle for fracture in 1979.   MEDICINES AT TIME OF DISCHARGE:  1.  Aggrenox 1 p.o. daily x14 days, then increase to b.i.d.  2.  TriCor 48 mg daily.  3.  Lotrel 10/20 mg 1 daily.  4.  UroXatral 10 mg a day.  5.  Tylenol 2 tablets 30 minutes to 1 hour prior to dose of Aggrenox for      first week, then p.r.n.   STUDIES PERFORMED:  1.  CT of the brain on admission shows diffuse brain atrophy, microvascular      ischemic changes with a remote left parietal and frontal infarctions.      No acute findings.  2.  MRI of the brain shows an acute infarct in the posterior circulation      including the left cerebellar hemisphere and left occipital lobe.  3.  MRA of the brain shows widely patent medium and large vessels.  4.  EKG shows normal sinus rhythm with nonspecific T wave abnormality.  Left      ventricular hypertrophy is new since the previous tracing.  5.  Cerebral angiogram performed by Dr. Grandville Silos. Deveshwar shows 50% to      60% stenosis, right vertebral artery at origin; 30% narrowing, left      internal carotid artery at the bulb; 30% to 40% narrowing, left common      carotid artery distally; calcified right carotid bulb with mild      stenosis; PCAs are okay.  6.  Two-dimensional echocardiogram has been performed; results  are pending.   LABORATORY STUDIES:  Cholesterol 155, triglycerides 310, HDL 28, LDL 75.  Hemoglobin A1c 5.5.  Homocysteine 13.29.  Hemoglobin 13.7, hematocrit 38.5  and 23.5, up from 8.6 on admission.  Sodium 133, glucose 120, otherwise,  chemistry okay.  INR and coagulations studies normal.  UA negative.  Urine  culture:  No growth -- 1 day.   HISTORY OF PRESENT ILLNESS:  [name] is an 77 year old right-  handed white male with a history of hypertension and dyslipidemia, who had  sudden loss of vision, right of the midline, this weekend, lasting for  minutes.  He did not tell his wife today.  He finished lunch about 12:30 and  his wife went upstairs to wrap Christmas gifts.  He sat in the chair and  fell asleep.  Somewhere  between 2:00 and 2:30, he woke up and became aware  that he could not walk and was stumbling into things on the way to the  bathroom.  He called upstairs to his wife and she noted the time to be about  2:15 and called EMS.  The patient was brought to the hospital, where a CT  showed no acute findings.  TPA was ordered and arrived.  His vision had  completely cleared in the interim and TPA was not given.  The patient was  admitted to the hospital for further stroke evaluation.   HOSPITAL COURSE:  MRI did reveal an acute infarct.  Cerebral angiogram  showed stenosis of the right vertebral artery, which we think is the origin  of the infarct.  Two-dimensional echocardiogram is still pending at the time  of discharge, though most likely is unrevealing.  The patient remained on IV  heparin during the hospitalization and was discharged on Aggrenox for  secondary stroke prevention.  Other risk factor identified was elevated  triglycerides, for which he was started on TriCor.  The patient had no  inpatient PT and OT needs and they recommended no outpatient therapy.  He  was anxious for discharge and was sent home in care of his wife.   CONDITION AT DISCHARGE:   Patient alert and oriented x3, though memory seems  relatively poor.  He has no aphasia or dysarthria.  He has dense right  homonymous hemianopsia.  He has no facial weakness.  He has no extremity  weakness or drift.  His sensation is intact.  Chest is clear and heart rate  is regular.  Abdomen is nontender.   DISCHARGE PLAN:  1.  Discharge home in care of wife.  2.  Outpatient physical and occupational therapies.  3.  Aggrenox for secondary stroke prevention.  4.  New TriCor.  5.  No driving for 2 weeks.  6.  Follow up with Dr. Janalyn Shy P. Sethi in 2-3 months.  7.  Follow up with Dr. Donia Guiles within the next 2-3 months.       SB/MEDQ  D:  08/17/2004  T:  08/18/2004  Job:  [id number]   cc:   Donia Guiles, M.D.  [address] [place]  [place] [address]  Fax: [phone]

## 2011-02-10 NOTE — Op Note (Signed)
NAME:  Michael Case, Michael Case              ACCOUNT NO.:  [id number]   MEDICAL RECORD NO.:  [id number]          PATIENT TYPE:  INP   LOCATION:  3005                         FACILITY:  [facility]   PHYSICIAN:  Pramod P. Pearlean Brownie, MD    DATE OF BIRTH:  January 20, 1921   DATE OF PROCEDURE:  DATE OF DISCHARGE:                                 OPERATIVE REPORT   PROCEDURE:  Transcranial Doppler bubble study.   CLINICAL HISTORY:  Michael Case is an 77 year old gentleman who was admitted  with two strokes within a one-week period.  He was initially admitted last  week with a right homonymous hemianopsia which was thought to be due to a  left occipital embolic infarction.  He was started on Aggrenox and Tricor  and discharged home.  Cardiac echo did not reveal significant source of  embolism.  The patient, however, returned a few days later, this time with a  right hemiparesis and some speech difficulties.  A repeat MRI scan showed a  new infarction in the left middle cerebral artery distal peripheral branch  distribution.  The rest of the stroke workup has shown 60% stenosis of the  right vertebral artery but otherwise no major carotid stenosis or basilar  stenosis.   PAST MEDICAL HISTORY:  Significant for hypertension, hyperlipidemia.  Recent  history of rectal bleeding due to piles.   MEDICATIONS:  Aggrenox, Tricor, Lotrel, and UroXatral.   PHYSICAL EXAMINATION:  GENERAL:  A pleasant elderly gentleman who is not in  distress.  VITAL SIGNS:  Is afebrile with a heart rate of 73 per minute, regular BP  116/75, respirations 16 per minute, saturations 98%.  NEUROLOGIC:  The patient is awake, alert, oriented x3.  Normal speech and  language function.  There is no aphasia, apraxia, or dysarthria.  Pupils are  irregular but reactive.  He has a dense right homonymous hemianopsia.  He  has significant right upper extremity weakness with grade 1-2 strength at  both shoulders and 1 at the elbows.  He can make a right  grip but is clearly  weak.  He can bend his fingers.  Right lower extremity strength is 3-4/5  proximally and distally.  There is no sensory loss.  Coordination is  impaired on the right.   IMPRESSION:  An 77 year old gentleman with recent left posterior cerebral as  well as middle cerebral artery infarctions in two different vascular  distributions without clearly identified source.   PLAN:  It would be appropriate to do a transcranial Doppler bubble study to  look for patent foramen ovale.  The procedure was fully explained to the  patient and his wife and risks and benefits were explained.   The right middle cerebral artery was insonated using standard protocol.  Agitated saline microbubbles were injected into an IV in the left forearm,  which had been previously placed.  No high-intensity transient signals  (HITS) were seen.  The procedure was repeated twice after performing  Valsalva maneuver, and again ho HITS were noted.   IMPRESSION:  Negative transcranial Doppler bubble study.  No evidence of  abnormal intracardiac communication  by today's study.  The results were  explained to the patient and his wife.       PPS/MEDQ  D:  08/26/2004  T:  08/27/2004  Job:  [id number]

## 2011-02-10 NOTE — H&P (Signed)
NAME:  Michael Case, Michael Case              ACCOUNT NO.:  [id number]   MEDICAL RECORD NO.:  [id number]          PATIENT TYPE:  INP   LOCATION:  1826                         FACILITY:  [facility]   PHYSICIAN:  Casimiro Needle L. Reynolds, M.D.DATE OF BIRTH:  June 09, 1921   DATE OF ADMISSION:  08/24/2004  DATE OF DISCHARGE:                                HISTORY & PHYSICAL   CHIEF COMPLAINT:  Right-sided weakness and speech disturbance.   HISTORY OF PRESENT ILLNESS:  This is an interim H&P for this 77 year old man  discharged from the stroke service a week ago after suffering a left  occipital embolic stroke with a right visual field loss.  He was worked up  extensively, and discharged on Aggrenox and Tricor.  The mechanism of the  stroke was felt to be atheroemboli from a narrowed right vertebral artery.  The patient reports that he was bathing at about 5:30 this evening, and  while drying off, noted the sudden onset of right-sided weakness and sensory  changes.  He could not dry off with his right hand, and then noticed trouble  with his right leg when he was walking.  He called out to his wife, who  noted that he had some hesitant slurred speech.  EMS was called, and the  patient was brought to the [facility] emergency room.  He gradually improved  en route, and was essentially normal by the time he got here, although he  feels subjectively that his right side has not come all the way back around  to normal, but is near his baseline.   PAST MEDICAL HISTORY/SOCIAL/FAMILY HISTORY:  As outlined in the recent  admission H&P of August 15, 2004.  New changes are primarily regarding his  admission to the stroke service from August 15, 2004 to August 17, 2004,  at which time he underwent extensive stroke work-up, including angiogram,  which revealed a 60% right vertebral artery stenosis; otherwise, benign.  Of  note, the result of his echocardiogram that was reportedly done during that  stay is not  available.   REVIEW OF SYSTEMS:  He was constipated enough last Saturday to take an  enema, and since that time he has been passing some bright red blood per  rectum.  A full 10-system review of systems is otherwise negative, except as  outlined in the HPI and the admission nursing record.   MEDICATIONS:  1.  Aggrenox one daily.  2.  Tricor 48 mg daily.  3.  Lotrel 10/20 mg daily.  4.  Uroxatral 10 mg daily.   PHYSICAL EXAMINATION:  VITAL SIGNS:  Temperature 97.1, blood pressure  163/71, pulse 80, respirations 22.  GENERAL:  This is a healthy-appearing man, supine, in no evidence distress.  HEENT:  Cranium is normocephalic and atraumatic.  Oropharynx is benign.  NECK:  Supple without carotid bruits.  HEART:  Regular rate and rhythm.  CHEST:  Clear to auscultation bilaterally.  ABDOMEN:  Obese, soft.  Normoactive bowel sounds.  EXTREMITIES:  No edema, 2+ pulses.  NEUROLOGIC:  Mental status - he is awake and alert.  Speech is fluent and  not dysarthric.  Mood is euthymic.  Affect appropriate.  Cranial nerves -  pupils equal and reactive.  Examination of the visual fields revealed some  decrease on the right.  Extraocular movements are full without nystagmus.  Face, tongue, and palate move normally and symmetrically.  Motor - normal  bulk and tone.  There is a tiny bit of weakness in the intrinsic muscle of  the right hand; otherwise, normal strength throughout.  Sensation -  diminished pinprick in the right hand compared to the left.  Otherwise  intact.  Coordination - rapid movements were performed well.  Finger-to-nose  revealed some dysmetria on the right.  Reflexes 2+ and symmetric.  Toes were  downgoing.   LABORATORY REVIEW:  CBC revealed a white count of 7.9, hemoglobin 13.7,  platelets 276,000.  Coags were normal.  BMET is remarkable for an elevated  glucose of 112.  BUN and creatinine are 22 and 1.6 respectively.  CT of the  head was personally reviewed and demonstrates  evolution of the known strokes  of the left occipital area, but no definite acute findings.   IMPRESSION:  1.  New left brain cerebrovascular accident/transient ischemic attack one      week after stroke admit, which brings up the possibility of whether any      further interventions need to be undertaken for preventing      cerebrovascular events such as anticoagulation for a cardiac source, or      possibly intervening in the narrowed vertebral artery.  2.  Hematochezia following constipation.  Plan - will hold off on heparin      due to the hematochezia.  Will repeat an MRI of the brain to see if      there has been a new stroke, and if so in what territory.  At that      point, give consideration possibly to further embolic work-up, possibly      to intervening on the narrowed right vertebral artery, if the new stroke      is in that distribution.  The stroke service to follow.       MLR/MEDQ  D:  08/25/2004  T:  08/25/2004  Job:  [id number]

## 2011-02-10 NOTE — Op Note (Signed)
NAME:  EMMITTE, SURGEON NO.:  [id number]   MEDICAL RECORD NO.:  [id number]          PATIENT TYPE:  INP   LOCATION:  3301                         FACILITY:  [facility]   PHYSICIAN:  Graylin Shiver, M.D.   DATE OF BIRTH:  1921/06/20   DATE OF PROCEDURE:  09/15/2004  DATE OF DISCHARGE:                                 OPERATIVE REPORT   PROCEDURE:  Esophagogastroduodenoscopy with epinephrine injection and  endoscopic cautery of the visible vessel on a duodenal ulcer.   INDICATIONS FOR PROCEDURE:  Melena.   CONSENT:  Informed consent was obtained after explanation of the risks of  bleeding, infection, and perforation.   PREMEDICATION:  Fentanyl 25 mcg intravenously, Versed 2.5 mg intravenously.   DESCRIPTION OF PROCEDURE:  With the patient in the left lateral decubitus  position, the Olympus gastroscope was inserted into the oropharynx and  passed into the esophagus.  It was advanced down the esophagus, then into  the stomach and into the duodenum.  The second portion of the duodenum  looked normal.  In the bulb of the duodenum, there were 2 ulcers.  One of  these ulcers was an elongated ulcer approximately 1 cm in size.  There was  no visible stigmata of bleeding on this ulcer.  The second ulcer was a 1 cm  ulcer with a visible vessel on it.  It was not actively bleeding.  The area  around the visible vessel and ulcer was injected with 2 mL of 1:10,000  epinephrine solution.  The endoscopic bicap probe was then passed.  The  visible vessel was cauterized with obliteration of this area.  No bleeding  occurred.  The stomach showed scattered erosions, no large ulcers, no active  bleeding.  The fundus and cardia were normal.  The esophagus looked normal.  He tolerated the procedure well without complications.   IMPRESSION:  1.  Two duodenal ulcers one of which had a visible vessel.  This was      injected and cauterized.  2.  Erosive gastritis.   PLAN:  1.  Observation,  continue Protonix.  2.  I would recommended avoidance of aspirin and nonsteroidal anti-      inflammatory drugs.  3.  In light of the gastrointestinal bleeding and ulcers, one must consider      carefully the continued use of Aggrenox in this patient.       SFG/MEDQ  D:  09/15/2004  T:  09/16/2004  Job:  [id number]   cc:   Corinna L. Lendell Caprice, MD   Sherin Quarry, MD   Donia Guiles, M.D.  [address] [place]  [place] [address]  Fax: [phone]

## 2012-10-24 IMAGING — CR DG ABDOMEN 2V
1 series · 1 of 1 positions shown · non-contrast
Comparison: None.

CLINICAL DATA: Dehydration, infection.  No abdominal pain.

ABDOMEN - 2 VIEW

[view not recorded]
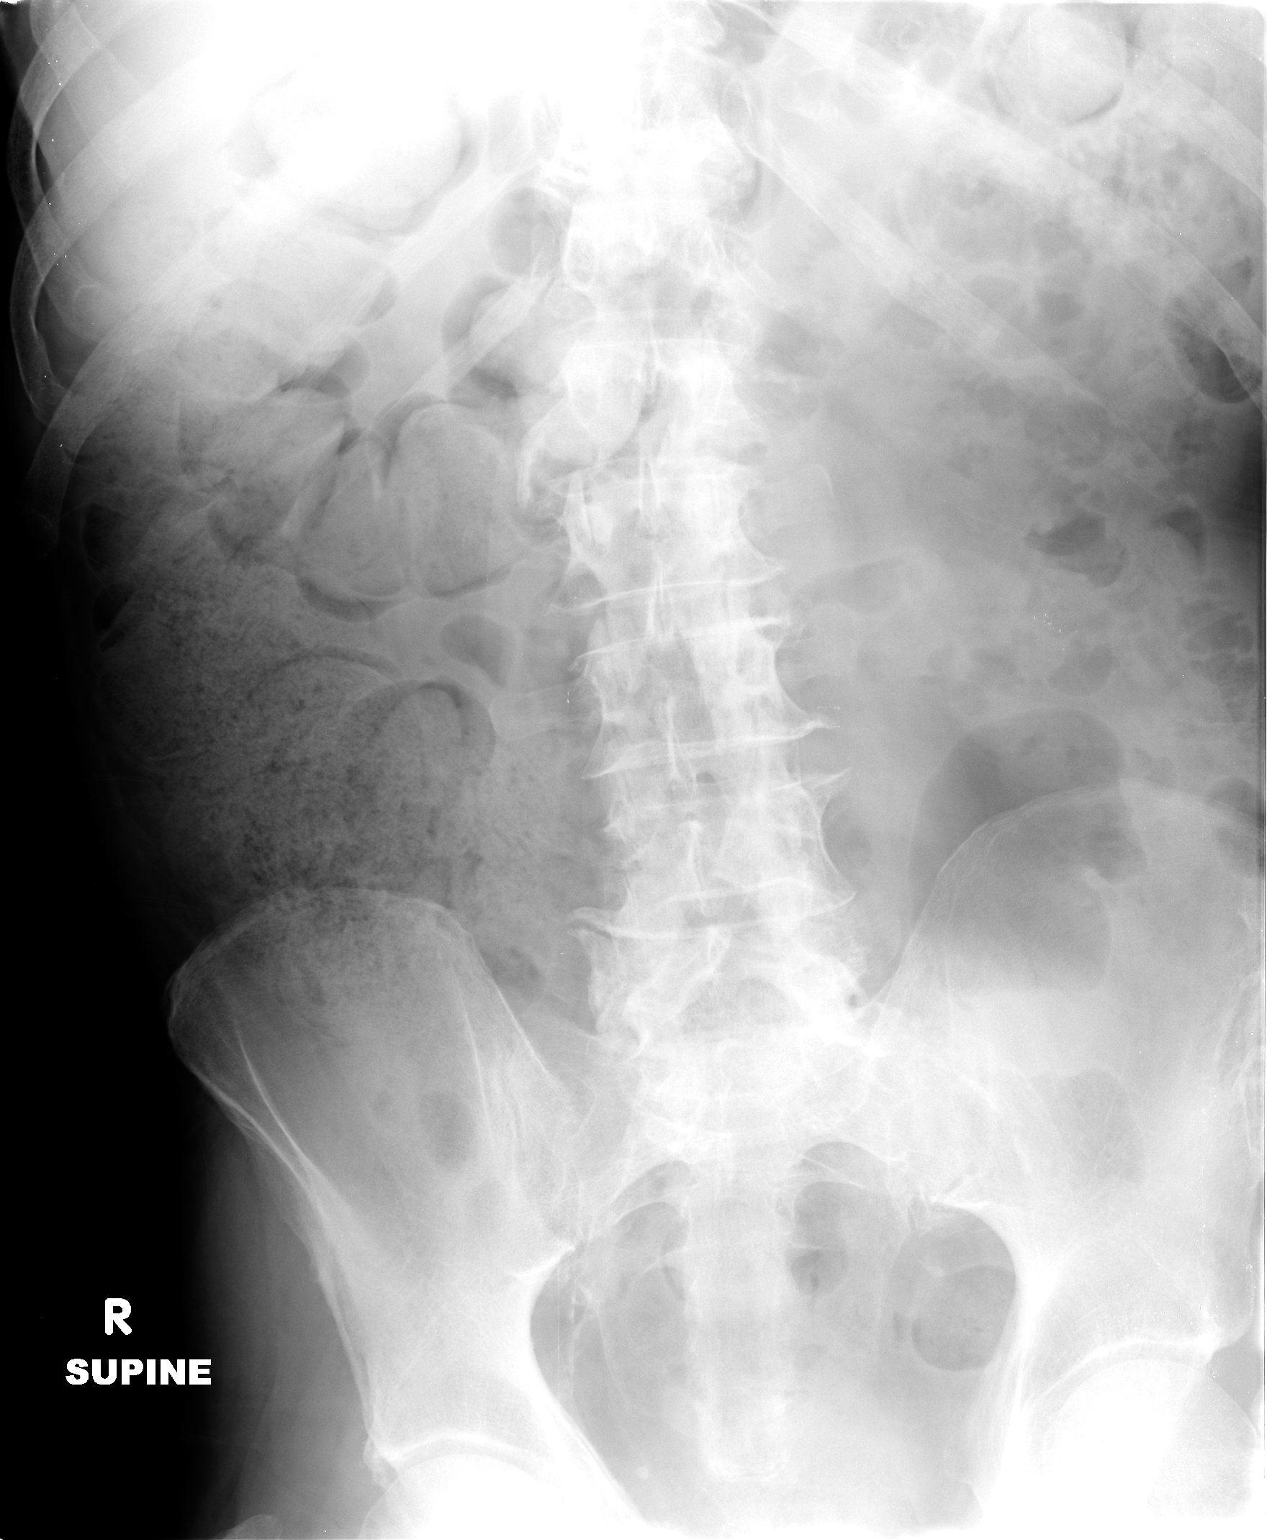

[1 of 1 positions shown; findings below may reference images not displayed]

FINDINGS: No dilated small bowel loops are seen in the abdomen.
There is a large volume of stool seen throughout the colon.  No
intra-abdominal free air is seen.  Visualized lung bases are clear.
Degenerative changes are seen in the spine.
IMPRESSION: No evidence of obstruction.  Large fecal burden suggesting
constipation.
